# Patient Record
Sex: Female | Born: 1980 | Race: Black or African American | Hispanic: No | Marital: Single | Smoking: Never smoker
Health system: Southern US, Community
[De-identification: ages and names within clinical notes are randomized; demographics above are authoritative.]

## PROBLEM LIST (undated history)

## (undated) ENCOUNTER — Emergency Department (HOSPITAL_COMMUNITY): Payer: Self-pay

---

## 2001-05-26 ENCOUNTER — Ambulatory Visit (HOSPITAL_COMMUNITY): Admission: RE | Admit: 2001-05-26 | Discharge: 2001-05-26 | Payer: Self-pay | Admitting: Orthopedic Surgery

## 2001-05-26 ENCOUNTER — Encounter: Payer: Self-pay | Admitting: Orthopedic Surgery

## 2001-09-22 ENCOUNTER — Encounter: Payer: Self-pay | Admitting: Emergency Medicine

## 2001-09-22 ENCOUNTER — Emergency Department (HOSPITAL_COMMUNITY): Admission: EM | Admit: 2001-09-22 | Discharge: 2001-09-22 | Payer: Self-pay | Admitting: Emergency Medicine

## 2003-06-16 ENCOUNTER — Other Ambulatory Visit: Admission: RE | Admit: 2003-06-16 | Discharge: 2003-06-16 | Payer: Self-pay | Admitting: Family Medicine

## 2003-09-22 ENCOUNTER — Other Ambulatory Visit: Admission: RE | Admit: 2003-09-22 | Discharge: 2003-09-22 | Payer: Self-pay | Admitting: Family Medicine

## 2005-09-07 ENCOUNTER — Inpatient Hospital Stay (HOSPITAL_COMMUNITY): Admission: AD | Admit: 2005-09-07 | Discharge: 2005-09-07 | Payer: Self-pay | Admitting: Obstetrics and Gynecology

## 2006-04-04 ENCOUNTER — Ambulatory Visit (HOSPITAL_COMMUNITY): Admission: RE | Admit: 2006-04-04 | Discharge: 2006-04-04 | Payer: Self-pay | Admitting: Obstetrics and Gynecology

## 2006-04-05 ENCOUNTER — Inpatient Hospital Stay (HOSPITAL_COMMUNITY): Admission: AD | Admit: 2006-04-05 | Discharge: 2006-04-08 | Payer: Self-pay | Admitting: Obstetrics and Gynecology

## 2010-06-19 ENCOUNTER — Emergency Department (INDEPENDENT_AMBULATORY_CARE_PROVIDER_SITE_OTHER): Payer: Self-pay

## 2010-06-19 ENCOUNTER — Emergency Department (HOSPITAL_BASED_OUTPATIENT_CLINIC_OR_DEPARTMENT_OTHER)
Admission: EM | Admit: 2010-06-19 | Discharge: 2010-06-19 | Disposition: A | Discharge status: Home / Self Care | Payer: Self-pay | Attending: Emergency Medicine | Admitting: Emergency Medicine

## 2010-06-19 DIAGNOSIS — N831 Corpus luteum cyst of ovary, unspecified side: Secondary | ICD-10-CM

## 2010-06-19 DIAGNOSIS — N83209 Unspecified ovarian cyst, unspecified side: Secondary | ICD-10-CM | POA: Insufficient documentation

## 2010-06-19 DIAGNOSIS — N9489 Other specified conditions associated with female genital organs and menstrual cycle: Secondary | ICD-10-CM | POA: Insufficient documentation

## 2010-06-19 LAB — URINALYSIS, ROUTINE W REFLEX MICROSCOPIC
Bilirubin Urine: NEGATIVE
Glucose, UA: NEGATIVE mg/dL
Hgb urine dipstick: NEGATIVE
Ketones, ur: NEGATIVE mg/dL
Nitrite: NEGATIVE
Protein, ur: NEGATIVE mg/dL
Specific Gravity, Urine: 1.015 (ref 1.005–1.030)
Urobilinogen, UA: 0.2 mg/dL (ref 0.0–1.0)
pH: 6.5 (ref 5.0–8.0)

## 2010-06-19 LAB — PREGNANCY, URINE: Preg Test, Ur: NEGATIVE

## 2010-06-19 LAB — URINE MICROSCOPIC-ADD ON

## 2010-06-19 LAB — WET PREP, GENITAL

## 2010-06-20 LAB — GC/CHLAMYDIA PROBE AMP, GENITAL: Chlamydia, DNA Probe: NEGATIVE

## 2010-06-21 LAB — URINE CULTURE

## 2010-08-31 ENCOUNTER — Emergency Department (HOSPITAL_BASED_OUTPATIENT_CLINIC_OR_DEPARTMENT_OTHER)
Admission: EM | Admit: 2010-08-31 | Discharge: 2010-08-31 | Disposition: A | Discharge status: Home / Self Care | Payer: Self-pay | Attending: Emergency Medicine | Admitting: Emergency Medicine

## 2010-08-31 DIAGNOSIS — K029 Dental caries, unspecified: Secondary | ICD-10-CM | POA: Insufficient documentation

## 2010-09-29 ENCOUNTER — Emergency Department (HOSPITAL_BASED_OUTPATIENT_CLINIC_OR_DEPARTMENT_OTHER)
Admission: EM | Admit: 2010-09-29 | Discharge: 2010-09-29 | Disposition: A | Discharge status: Home / Self Care | Payer: Self-pay | Attending: Emergency Medicine | Admitting: Emergency Medicine

## 2010-09-29 DIAGNOSIS — K089 Disorder of teeth and supporting structures, unspecified: Secondary | ICD-10-CM | POA: Insufficient documentation

## 2011-10-10 ENCOUNTER — Encounter (HOSPITAL_BASED_OUTPATIENT_CLINIC_OR_DEPARTMENT_OTHER): Payer: Self-pay | Admitting: *Deleted

## 2011-10-10 ENCOUNTER — Emergency Department (HOSPITAL_BASED_OUTPATIENT_CLINIC_OR_DEPARTMENT_OTHER)
Admission: EM | Admit: 2011-10-10 | Discharge: 2011-10-10 | Disposition: A | Discharge status: Home / Self Care | Payer: Self-pay | Attending: Emergency Medicine | Admitting: Emergency Medicine

## 2011-10-10 DIAGNOSIS — R21 Rash and other nonspecific skin eruption: Secondary | ICD-10-CM | POA: Insufficient documentation

## 2011-10-10 DIAGNOSIS — L239 Allergic contact dermatitis, unspecified cause: Secondary | ICD-10-CM

## 2011-10-10 DIAGNOSIS — L309 Dermatitis, unspecified: Secondary | ICD-10-CM

## 2011-10-10 DIAGNOSIS — L299 Pruritus, unspecified: Secondary | ICD-10-CM

## 2011-10-10 MED ORDER — PREDNISONE 10 MG PO TABS: ORAL_TABLET | ORAL | Status: DC

## 2011-10-10 NOTE — ED Notes (Signed)
Pt reports an urticarial generalized rash x 3 days unrelieved after taking topical hydrocortisone.

## 2011-10-10 NOTE — ED Notes (Signed)
Hannah, PA-C at  bedside.

## 2011-10-10 NOTE — ED Provider Notes (Signed)
History     CSN: [id number]  Arrival date & time 10/10/11  1521   First MD Initiated Contact with Patient 10/10/11 1552      Chief Complaint  Patient presents with  . Rash    (Consider location/radiation/quality/duration/timing/severity/associated sxs/prior treatment) HPI Comments: 31yo female presents with a new onset rash x3 days [place]  Pt states she did not touch the plants or any other wildlife.  Hx of eczema, but patient states this is different.  Rash is severely itchy.  No new cosmetics, perfumes, laundry detergent, lotion, medications or foods.  She has taken benadryl and used 1% hydrocortisone cream without relief.  No one else in the household has been affected.  No fever, choriza, cough, wheezing or swelling.    Patient is a 31 y.o. female presenting with rash. The history is provided by the patient and medical records.  Rash  This is a new problem. The current episode started more than 2 days ago. The problem has not changed since onset.The problem is associated with an unknown factor. There has been no fever. The rash is present on the torso, back, abdomen, left upper leg, right upper leg, right lower leg and left lower leg. The pain is at a severity of 0/10. The patient is experiencing no pain. Associated symptoms include itching. Pertinent negatives include no blisters, no pain and no weeping. She has tried antihistamines and anti-itch cream for the symptoms. The treatment provided no relief.    History reviewed. No pertinent past medical history.  History reviewed. No pertinent past surgical history.  History reviewed. No pertinent family history.  History  Substance Use Topics  . Smoking status: Never Smoker   . Smokeless tobacco: Not on file  . Alcohol Use: No    OB History    Grav Para Term Preterm Abortions TAB SAB Ect Mult Living                  Review of Systems  Constitutional: Negative for fever and appetite change.  HENT:  Negative for congestion, facial swelling, rhinorrhea, sneezing, mouth sores, neck stiffness and sinus pressure.   Eyes: Negative for redness and itching.  Respiratory: Negative for cough, shortness of breath and wheezing.   Gastrointestinal: Negative for nausea, vomiting, abdominal pain, diarrhea and constipation.  Genitourinary: Negative for dysuria and decreased urine volume.  Musculoskeletal: Negative for joint swelling.  Skin: Positive for itching and rash. Negative for pallor.  Neurological: Negative for dizziness and headaches.  Hematological: Negative for adenopathy. Does not bruise/bleed easily.  All other systems reviewed and are negative.    Allergies  Review of patient's allergies indicates no known allergies.  Home Medications  No current outpatient prescriptions on file.  BP 116/70  Pulse 70  Temp 98.3 F (36.8 C)  Resp 16  Ht 5\' 4"  (1.626 m)  Wt 150 lb (68.04 kg)  BMI 25.75 kg/m2  SpO2 100%  LMP 09/27/2011  Physical Exam  Nursing note and vitals reviewed. Constitutional: She appears well-developed and well-nourished. No distress.  HENT:  Head: Normocephalic and atraumatic.  Mouth/Throat: Oropharynx is clear and moist. No oropharyngeal exudate.       No petechiae or lesion in the mouth or oropharynx.  Eyes: Conjunctivae are normal. Pupils are equal, round, and reactive to light. Right eye exhibits no discharge. Left eye exhibits no discharge. No scleral icterus.  Neck: Normal range of motion. Neck supple.  Cardiovascular: Normal rate, regular rhythm and normal heart sounds.  Pulmonary/Chest: Effort normal. She has no wheezes. She has rales.  Lymphadenopathy:    She has no cervical adenopathy.  Neurological: She is alert. She has normal reflexes.  Skin: Skin is warm and dry. Rash noted. She is not diaphoretic.       Patches of papular rash seen on back, chest, abdomen, arms, upper thighs and lower legs.  No lesions between the digits of upper or lower  extremity, on the palms or soles of feet, or on the face/scalp.  Patches on the knees, neck and elbow appear to have an eczema component.  No petechiae noted.    Psychiatric: She has a normal mood and affect. Her behavior is normal. Judgment and thought content normal.    ED Course  Procedures (including critical care time)  Labs Reviewed - No data to display No results found.   No diagnosis found.    MDM  Pt presents with rash x 3 days without further symptoms. Hx of eczema.  No petechiae, fever, or other systemic symptoms.  Some areas of rash look like eczema.  Possible allergic dermatitis.  Tx with benadryl PO for itching and prednisone dose pack.  F/U with dermatologist (appointment already scheduled by patient for 10/13/11).          Dahlia Client Antanisha Mohs, PA-C 10/10/11 1637

## 2011-10-10 NOTE — ED Notes (Signed)
Pt c/o rash to entire body x 2 days 

## 2011-10-13 NOTE — ED Provider Notes (Signed)
Medical screening examination/treatment/procedure(s) were performed by non-physician practitioner and as supervising physician I was immediately available for consultation/collaboration.   Gwyneth Sprout, MD 10/13/11 [phone]

## 2011-10-28 ENCOUNTER — Encounter (HOSPITAL_BASED_OUTPATIENT_CLINIC_OR_DEPARTMENT_OTHER): Payer: Self-pay | Admitting: *Deleted

## 2011-10-28 ENCOUNTER — Emergency Department (HOSPITAL_BASED_OUTPATIENT_CLINIC_OR_DEPARTMENT_OTHER)
Admission: EM | Admit: 2011-10-28 | Discharge: 2011-10-28 | Disposition: A | Discharge status: Home / Self Care | Payer: Self-pay | Attending: Emergency Medicine | Admitting: Emergency Medicine

## 2011-10-28 DIAGNOSIS — R21 Rash and other nonspecific skin eruption: Secondary | ICD-10-CM | POA: Insufficient documentation

## 2011-10-28 MED ORDER — PREDNISONE 10 MG PO TABS: 20.0000 mg | ORAL_TABLET | Freq: Every day | ORAL | Status: DC

## 2011-10-28 NOTE — ED Notes (Addendum)
Rash on arms and torso, itching. Was seen a few weeks ago for same, but rash has returned.

## 2011-10-28 NOTE — ED Provider Notes (Signed)
History   This chart was scribed for Hilario Quarry, MD by Shari Heritage. The Emily Mcguire was seen in room MH07/MH07. Emily Mcguire's care was started at 1453.     CSN: [id number]  Arrival date & time 10/28/11  1453   First MD Initiated Contact with Emily Mcguire 10/28/11 1541      Chief Complaint  Emily Mcguire presents with  . Rash    (Consider location/radiation/quality/duration/timing/severity/associated sxs/prior treatment) Emily Mcguire is a 31 y.o. female presenting with rash. The history is provided by the Emily Mcguire. No language interpreter was used.  Rash  This is a recurrent problem. The current episode started yesterday. The problem has not changed since onset.The problem is associated with an unknown factor. There has been no fever. The rash is present on the abdomen, back, left arm, right arm, right upper leg and left upper leg. The Emily Mcguire is experiencing no pain. Associated symptoms include itching. Pertinent negatives include no blisters, no pain and no weeping. She has tried steriods and antihistamines for the symptoms.   Emily Mcguire is a 31 y.o. female who presents to the Emergency Department complaining of an recurrent, itchy rash to both arms, both thighs, chest and back onset yesterday. Emily Mcguire denies fever, cough, HA and SOB. She does not take any regular medicines. Emily Mcguire lives with son and he doesn't have any rashes. Emily Mcguire has been taking Benadryl to minimal relief, but hasn't taken any today. Emily Mcguire denies any significant medical or surgical history. Emily Mcguire doesn't smoke. Emily Mcguire drinks occasionally.  Emily Mcguire states that she was treated here for the same rash by Oliver Barre, PA on 10/10/11. She was prescribed a dosage pack of Prednisone that she finished several days ago. The Prednisone provided significant relief from the rash and itching, but the rash returned after her Prednisone course ended. Emily Mcguire has an appointment scheduled with her dermatologist on August 20.  History  reviewed. No pertinent past medical history.  History reviewed. No pertinent past surgical history.  No family history on file.  History  Substance Use Topics  . Smoking status: Never Smoker   . Smokeless tobacco: Not on file  . Alcohol Use: No    OB History    Grav Para Term Preterm Abortions TAB SAB Ect Mult Living                  Review of Systems  Skin: Positive for itching and rash.  All other systems reviewed and are negative.    Allergies  Review of Emily Mcguire's allergies indicates no known allergies.  Home Medications   Current Outpatient Rx  Name Route Sig Dispense Refill  . DIPHENHYDRAMINE HCL 12.5 MG/5ML PO LIQD Oral Take 10 mg by mouth 4 (four) times daily as needed. Emily Mcguire used this medication for her rash.    Marland Kitchen PREDNISONE 10 MG PO TABS  6,5,4,3,2,1 taper 21 tablet 0    BP 127/69  Pulse 82  Temp 98.6 F (37 C) (Oral)  Resp 16  Ht 5\' 5"  (1.651 m)  Wt 155 lb (70.308 kg)  BMI 25.79 kg/m2  SpO2 100%  LMP 09/27/2011  Physical Exam  Nursing note and vitals reviewed. Constitutional: She is oriented to person, place, and time. She appears well-developed and well-nourished.  HENT:  Head: Normocephalic and atraumatic.  Eyes: Conjunctivae and EOM are normal. Pupils are equal, round, and reactive to light.  Neck: Normal range of motion. Neck supple.  Cardiovascular: Normal rate and regular rhythm.   Pulmonary/Chest: Effort normal and breath sounds normal.  Abdominal:  Soft. Bowel sounds are normal.  Musculoskeletal: Normal range of motion.  Neurological: She is alert and oriented to person, place, and time.  Skin: Skin is warm and dry. Rash noted. Rash is macular and papular.       Macular, papular diffuse rash to arms, abdomen and chest.  Psychiatric: She has a normal mood and affect.    ED Course  Procedures (including critical care time) DIAGNOSTIC STUDIES: Oxygen Saturation is 100% on room air, normal by my interpretation.    COORDINATION OF  CARE: 3:43pm- Emily Mcguire informed of current plan for treatment and evaluation and agrees with plan at this time. Will discharge Emily Mcguire home with a prescription for 5-day course of Prednisone.   Labs Reviewed - No data to display No results found.   No diagnosis found.    MDM  I personally performed the services described in this documentation, which was scribed in my presence. The recorded information has been reviewed and considered.   Hilario Quarry, MD 11/05/11 1556

## 2017-05-16 ENCOUNTER — Emergency Department (HOSPITAL_COMMUNITY)
Admission: EM | Admit: 2017-05-16 | Discharge: 2017-05-16 | Disposition: A | Discharge status: Home / Self Care | Payer: Self-pay | Attending: Emergency Medicine | Admitting: Emergency Medicine

## 2017-05-16 ENCOUNTER — Other Ambulatory Visit: Payer: Self-pay

## 2017-05-16 ENCOUNTER — Encounter (HOSPITAL_COMMUNITY): Payer: Self-pay | Admitting: Emergency Medicine

## 2017-05-16 DIAGNOSIS — M25562 Pain in left knee: Secondary | ICD-10-CM | POA: Insufficient documentation

## 2017-05-16 MED ORDER — DICLOFENAC SODIUM 1 % TD GEL: 2.0000 g | Freq: Four times a day (QID) | TRANSDERMAL | 0 refills | Status: AC

## 2017-05-16 MED ORDER — IBUPROFEN 600 MG PO TABS: 600.0000 mg | ORAL_TABLET | Freq: Four times a day (QID) | ORAL | 0 refills | Status: DC | PRN

## 2017-05-16 NOTE — ED Triage Notes (Signed)
Pt verbalizes left knee to left calf pain ongoing for 1.5 months; denies injury.

## 2017-05-16 NOTE — Discharge Instructions (Signed)
Wear the knee sleeve as needed to help with your pain.  Take 600 mg of ibuprofen with food every 8 hours for pain control.  You can also alternate with Tylenol, 650 mg, and take 1 dose of ibuprofen and then 3 hours later take a dose of Tylenol and repeat this regimen.  A thin layer of Voltaren gel can be applied to the left knee over areas that are sore up to 4 times daily. Apply ice for 15-20 minutes as frequently as needed to help with pain and swelling.  When you are sitting and resting, please elevate the left leg above the level of your heart to help with pain and swelling.  Start to strengthen and exercise the knee with the attached exercises as your pain allows.  For the next week at the gym, try to avoid activities that directly use the left knee, such as aerobics, so that you do not further exacerbate your pain.  If your pain does not start to improve with this regimen in the next week, please call Emily Mcguire and schedule follow-up appointment with sports medicine.  If you develop new or worsening symptoms, including if the knee becomes red and hot to the touch, if you develop shortness of breath, fever, chills, weakness, or numbness in the left leg, please return to the emergency department for reevaluation.

## 2017-05-16 NOTE — ED Provider Notes (Signed)
[facility] Provider Note   CSN: [id number] Arrival date & time: 05/16/17  1122     History   Chief Complaint Chief Complaint  Patient presents with  . Leg Pain    HPI Emily Mcguire is a 37 y.o. female with a history of left knee pain that began about a month and a half ago and has gradually worsened.  The pain is constant, characterized as pulling, and worse with flexion of the left knee the pain radiates down the posterior left calf.  No alleviating symptoms.  She is treated her pain with Tylenol prior to arrival.  She recently began going to the gym and working with a trainer performing 30 minutes of aerobic exercise several times a week approximately a month and a half ago.  No history of left knee surgery or injury.  She denies left ankle or hip pain, right leg pain, numbness, weakness, fever, chills, chest pain, shortness of breath, or rash.  No past medical history of gout.  No family history of DVT or PE.  She does not take oral contraceptives.  No recent surgery, immobilization, or long travel.  The history is provided by the patient. No language interpreter was used.  Leg Pain   This is a new problem. The current episode started more than 1 week ago. The problem has been gradually worsening. The pain is present in the left knee. Quality: "pulling" The pain is mild. Pertinent negatives include no numbness and full range of motion. The symptoms are aggravated by activity. Treatments tried: Tylenol. The treatment provided no relief. There has been no history of extremity trauma. Family history is significant for no rheumatoid arthritis and no gout.    History reviewed. No pertinent past medical history.  There are no active problems to display for this patient.   History reviewed. No pertinent surgical history.  OB History    No data available       Home Medications    Prior to Admission medications   Medication Sig Start Date  End Date Taking? Authorizing Provider  diphenhydrAMINE (BENADRYL) 25 MG tablet Take 25 mg by mouth every 6 (six) hours as needed. For itching.   Yes [provider]  diclofenac sodium (VOLTAREN) 1 % GEL Apply 2 g topically 4 (four) times daily. 05/16/17   Dannika Hilgeman A, PA-C  ibuprofen (ADVIL,MOTRIN) 600 MG tablet Take 1 tablet (600 mg total) by mouth every 6 (six) hours as needed. 05/16/17   Armonii Sieh A, PA-C    Family History No family history on file.  Social History Social History   Tobacco Use  . Smoking status: Never Smoker  Substance Use Topics  . Alcohol use: No  . Drug use: No     Allergies   Patient has no known allergies.   Review of Systems Review of Systems  Constitutional: Negative for activity change.  Respiratory: Negative for shortness of breath.   Cardiovascular: Negative for chest pain.  Gastrointestinal: Negative for abdominal pain.  Musculoskeletal: Positive for arthralgias, gait problem and myalgias. Negative for back pain and joint swelling.  Skin: Negative for rash.  Neurological: Negative for weakness and numbness.     Physical Exam Updated Vital Signs BP 130/88   Pulse 77   Temp 97.9 F (36.6 C)   Resp 15   LMP 05/03/2017   SpO2 100%   Physical Exam  Constitutional: No distress.  HENT:  Head: Normocephalic.  Eyes: Conjunctivae are normal.  Neck:  Neck supple.  Cardiovascular: Normal rate and regular rhythm. Exam reveals no gallop and no friction rub.  No murmur heard. Pulmonary/Chest: Effort normal. No respiratory distress.  Abdominal: Soft. She exhibits no distension.  Musculoskeletal:  Mild tenderness to palpation along the medial joint line the left knee and to the posterior knee.  No lateral joint line tenderness.  Full active and passive range of motion of the left knee, hip, and ankle.  No overlying swelling, warmth, or erythema to the left knee.  Pain is increased with flexion.  The patella tracks well.  Negative  anterior and posterior drawer test.  Negative valgus and varus stress test.   Neurological: She is alert.  Skin: Skin is warm. No rash noted.  Psychiatric: Her behavior is normal.  Nursing note and vitals reviewed.    ED Treatments / Results  Labs (all labs ordered are listed, but only abnormal results are displayed) Labs Reviewed - No data to display  EKG  EKG Interpretation None       Radiology No results found.  Procedures Procedures (including critical care time)  Medications Ordered in ED Medications - No data to display   Initial Impression / Assessment and Plan / ED Course  I have reviewed the triage vital signs and the nursing notes.  Pertinent labs & imaging results that were available during my care of the patient were reviewed by me and considered in my medical decision making (see chart for details).     37 year old female presenting with left knee pain that began approximately 1.5 months ago after she started going to the gym and performing aerobic exercise several times a week.  Physical exam, she has mild tenderness to palpation over the medial joint line and to the posterior knee.  She declines an x-ray and pain control in the ED at this time.  Suspect musculoskeletal injury, possible tendinitis versus Baker's cyst.  Doubt gout, septic joint, injury to the ACL, PCL, MCL, LCL, dislocation, or fracture.  Pt advised to follow up with orthopedics if symptoms persist for possibility of missed fracture diagnosis. Patient given brace while in ED, conservative therapy recommended and discussed. Patient will be dc home & is agreeable with above plan.   Final Clinical Impressions(s) / ED Diagnoses   Final diagnoses:  Acute pain of left knee    ED Discharge Orders        Ordered    diclofenac sodium (VOLTAREN) 1 % GEL  4 times daily     05/16/17 1443    ibuprofen (ADVIL,MOTRIN) 600 MG tablet  Every 6 hours PRN     05/16/17 1443       Devona Holmes A,  PA-C 05/16/17 1453    Charlynne Pander, MD 05/16/17 1550

## 2020-01-07 ENCOUNTER — Ambulatory Visit: Payer: Medicaid Other | Admitting: Obstetrics and Gynecology

## 2020-01-12 ENCOUNTER — Other Ambulatory Visit: Payer: Self-pay | Admitting: Family Medicine

## 2020-01-12 DIAGNOSIS — Z1231 Encounter for screening mammogram for malignant neoplasm of breast: Secondary | ICD-10-CM

## 2020-02-05 ENCOUNTER — Other Ambulatory Visit: Payer: Self-pay | Admitting: Nurse Practitioner

## 2020-02-05 ENCOUNTER — Other Ambulatory Visit: Payer: Self-pay | Admitting: Pediatrics

## 2020-02-05 DIAGNOSIS — N6452 Nipple discharge: Secondary | ICD-10-CM

## 2020-02-15 ENCOUNTER — Encounter: Payer: Self-pay | Admitting: Obstetrics and Gynecology

## 2020-02-15 ENCOUNTER — Ambulatory Visit (INDEPENDENT_AMBULATORY_CARE_PROVIDER_SITE_OTHER): Payer: Medicaid Other | Admitting: Obstetrics and Gynecology

## 2020-02-15 ENCOUNTER — Other Ambulatory Visit: Payer: Self-pay

## 2020-02-15 VITALS — BP 126/78 | HR 72 | Ht 65.0 in | Wt 173.0 lb

## 2020-02-15 DIAGNOSIS — Z30430 Encounter for insertion of intrauterine contraceptive device: Secondary | ICD-10-CM

## 2020-02-15 LAB — POCT URINE PREGNANCY: Preg Test, Ur: NEGATIVE

## 2020-02-15 MED ORDER — LEVONORGESTREL 20 MCG/24HR IU IUD: INTRAUTERINE_SYSTEM | Freq: Once | INTRAUTERINE | Status: AC

## 2020-02-15 NOTE — Addendum Note (Signed)
Addended by: Leola Brazil on: 02/15/2020 09:04 AM   Modules accepted: Orders

## 2020-02-15 NOTE — Progress Notes (Signed)
39 yo P1 with BMI 28 who is here for contraception counseling. Patient reports a monthly period lasting 8 days heavy in flow. She has previously tried COC and reports irregular vaginal bleeding. Patient states that the birth control that worked the best for her was the [name] IUD. She is interested in having the IUD today. Patient is without any complaints. She denies pelvic pain or abnormal discharge. She is sexually active using condoms for contraception  No past medical history on file. No past surgical history on file. No family history on file. Social History   Tobacco Use  . Smoking status: Never Smoker  Substance Use Topics  . Alcohol use: No  . Drug use: No   ROS See pertinent in HPI. All other systems reviewed and non contributory  Blood pressure 126/78, pulse 72, height 5\' 5"  (1.651 m), weight 173 lb (78.5 kg), last menstrual period 02/09/2020.  GENERAL: Well-developed, well-nourished female in no acute distress.  ABDOMEN: Soft, nontender, nondistended. No organomegaly. PELVIC: Normal external female genitalia. Vagina is pink and rugated.  Normal discharge. Normal appearing cervix. Uterus is normal in size.  No adnexal mass or tenderness. EXTREMITIES: No cyanosis, clubbing, or edema, 2+ distal pulses.  A/P 39 yo with  - Normal pap smear 03/2019 - Patient was referred for mammogram by PCP - IUD Procedure Note Patient identified, informed consent performed, signed copy in chart, time out was performed.  Urine pregnancy test negative.  Speculum placed in the vagina.  Cervix visualized.  Cleaned with Betadine x 2.  Grasped anteriorly with a single tooth tenaculum.  Uterus sounded to 8 cm.  [name] IUD placed per manufacturer's recommendations.  Strings trimmed to 3 cm. Tenaculum was removed, good hemostasis noted.  Patient tolerated procedure well.   Patient given post procedure instructions and [name] care card with expiration date.  Patient is asked to check IUD strings  periodically and follow up in 4-6 weeks for IUD check.

## 2020-03-02 ENCOUNTER — Other Ambulatory Visit: Payer: Medicaid Other

## 2020-03-02 DIAGNOSIS — Z20822 Contact with and (suspected) exposure to covid-19: Secondary | ICD-10-CM

## 2020-03-03 LAB — NOVEL CORONAVIRUS, NAA: SARS-CoV-2, NAA: NOT DETECTED

## 2020-03-03 LAB — SARS-COV-2, NAA 2 DAY TAT

## 2020-03-04 ENCOUNTER — Telehealth: Payer: Self-pay

## 2020-03-04 NOTE — Telephone Encounter (Signed)
Patient called to get her COVID test results informed patient that results were negative.

## 2020-03-08 ENCOUNTER — Ambulatory Visit
Admission: RE | Admit: 2020-03-08 | Discharge: 2020-03-08 | Disposition: A | Discharge status: Home / Self Care | Payer: Medicaid Other | Source: Ambulatory Visit | Attending: Nurse Practitioner | Admitting: Nurse Practitioner

## 2020-03-08 ENCOUNTER — Other Ambulatory Visit: Payer: Self-pay

## 2020-03-08 DIAGNOSIS — N6452 Nipple discharge: Secondary | ICD-10-CM

## 2020-03-14 ENCOUNTER — Ambulatory Visit: Payer: Medicaid Other | Admitting: Obstetrics and Gynecology

## 2020-04-01 ENCOUNTER — Encounter: Payer: Self-pay | Admitting: Obstetrics and Gynecology

## 2020-04-01 ENCOUNTER — Other Ambulatory Visit: Payer: Self-pay

## 2020-04-01 ENCOUNTER — Ambulatory Visit (INDEPENDENT_AMBULATORY_CARE_PROVIDER_SITE_OTHER): Payer: Medicaid Other | Admitting: Obstetrics and Gynecology

## 2020-04-01 VITALS — BP 133/76 | HR 84 | Ht 65.0 in | Wt 170.7 lb

## 2020-04-01 DIAGNOSIS — Z30431 Encounter for routine checking of intrauterine contraceptive device: Secondary | ICD-10-CM

## 2020-04-01 NOTE — Progress Notes (Signed)
40 yo here for IUD check. Patient had IUD inserted on 02/15/20. She reports intermittent vaginal spotting since insertion. She denies any pelvic pain or abnormal discharge. Patient has not been sexually active since insertion  No past medical history on file. No past surgical history on file. Family History  Problem Relation Age of Onset  . Breast cancer Mother   . Breast cancer Maternal Aunt    Social History   Tobacco Use  . Smoking status: Never Smoker  . Smokeless tobacco: Never Used  Substance Use Topics  . Alcohol use: No  . Drug use: No   ROS See pertinent in HPI. All other systems reviewed and negative Blood pressure 133/76, pulse 84, height 5\' 5"  (1.651 m), weight 170 lb 11.2 oz (77.4 kg).  GENERAL: Well-developed, well-nourished female in no acute distress.  ABDOMEN: Soft, nontender, nondistended. No organomegaly. PELVIC: Normal external female genitalia. Vagina is pink and rugated.  Normal discharge. Normal appearing cervix with IUD strings extending from the os 3 cm and curled anteriorly EXTREMITIES: No cyanosis, clubbing, or edema, 2+ distal pulses.  A/P 40 yo here for IUD check - IUD appears to be in the appropriate location - Reassurance provided regarding irregular bleeding post insertion which is common for up to 6 months - RTC prn

## 2020-04-01 NOTE — Progress Notes (Signed)
Pt is in the office for IUD string check, inserted on 02-15-20.

## 2020-08-11 ENCOUNTER — Ambulatory Visit
Admission: RE | Admit: 2020-08-11 | Discharge: 2020-08-11 | Disposition: A | Discharge status: Home / Self Care | Payer: Medicaid Other | Source: Ambulatory Visit | Attending: Family | Admitting: Family

## 2020-08-11 ENCOUNTER — Other Ambulatory Visit: Payer: Self-pay | Admitting: Family

## 2020-08-11 DIAGNOSIS — W19XXXA Unspecified fall, initial encounter: Secondary | ICD-10-CM

## 2020-08-11 DIAGNOSIS — M25521 Pain in right elbow: Secondary | ICD-10-CM

## 2021-02-06 ENCOUNTER — Ambulatory Visit: Payer: Medicaid Other

## 2021-04-21 ENCOUNTER — Other Ambulatory Visit: Payer: Self-pay | Admitting: *Deleted

## 2021-04-21 ENCOUNTER — Encounter: Payer: Self-pay | Admitting: *Deleted

## 2021-04-21 MED ORDER — METRONIDAZOLE 500 MG PO TABS: 500.0000 mg | ORAL_TABLET | Freq: Two times a day (BID) | ORAL | 0 refills | Status: DC

## 2021-04-21 NOTE — Progress Notes (Signed)
TC from patient report symptoms of BV. RX sent per protocol

## 2021-08-14 ENCOUNTER — Other Ambulatory Visit: Payer: Self-pay | Admitting: Nurse Practitioner

## 2021-08-14 DIAGNOSIS — Z1231 Encounter for screening mammogram for malignant neoplasm of breast: Secondary | ICD-10-CM

## 2021-08-18 ENCOUNTER — Ambulatory Visit
Admission: RE | Admit: 2021-08-18 | Discharge: 2021-08-18 | Disposition: A | Discharge status: Home / Self Care | Payer: Medicaid Other | Source: Ambulatory Visit | Attending: Nurse Practitioner | Admitting: Nurse Practitioner

## 2021-08-18 DIAGNOSIS — Z1231 Encounter for screening mammogram for malignant neoplasm of breast: Secondary | ICD-10-CM

## 2021-09-25 ENCOUNTER — Telehealth: Payer: Self-pay | Admitting: *Deleted

## 2021-09-25 NOTE — Telephone Encounter (Signed)
Pt called to office stating she is having some spotting and ?yeast infection.  Pt states she has appt later this week but would like treatment. Attempt to return call, LVM to call office.

## 2021-09-27 ENCOUNTER — Other Ambulatory Visit: Payer: Self-pay | Admitting: *Deleted

## 2021-09-27 MED ORDER — FLUCONAZOLE 150 MG PO TABS: 150.0000 mg | ORAL_TABLET | Freq: Once | ORAL | 0 refills | Status: AC

## 2021-09-27 NOTE — Progress Notes (Signed)
Pt called office with symptoms of yeast infection.  Diflucan sent per protocol.

## 2021-10-12 ENCOUNTER — Ambulatory Visit (INDEPENDENT_AMBULATORY_CARE_PROVIDER_SITE_OTHER): Payer: Medicaid Other | Admitting: Obstetrics and Gynecology

## 2021-10-12 ENCOUNTER — Other Ambulatory Visit (HOSPITAL_COMMUNITY)
Admission: RE | Admit: 2021-10-12 | Discharge: 2021-10-12 | Disposition: A | Discharge status: Home / Self Care | Payer: Medicaid Other | Source: Ambulatory Visit | Attending: Obstetrics and Gynecology | Admitting: Obstetrics and Gynecology

## 2021-10-12 ENCOUNTER — Encounter: Payer: Self-pay | Admitting: Obstetrics and Gynecology

## 2021-10-12 VITALS — BP 122/79 | HR 79 | Ht 65.0 in | Wt 193.0 lb

## 2021-10-12 DIAGNOSIS — Z01419 Encounter for gynecological examination (general) (routine) without abnormal findings: Secondary | ICD-10-CM

## 2021-10-12 DIAGNOSIS — Z30431 Encounter for routine checking of intrauterine contraceptive device: Secondary | ICD-10-CM

## 2021-10-12 NOTE — Progress Notes (Signed)
Subjective:     Emily Mcguire is a 41 y.o. female P1 with LMP 09/29/21 and BMI 32 who is here for a comprehensive physical exam. The patient reports a month long history of vaginal bleeding with passage of clots. The bleeding stopped a few days ago. This was unusual for her as he typically has a 5-7 day period. Patient is sexually active using IUD for contraception. She denies pelvic pain or abnormal discharge. Patient denies urinary incontinence.   No past medical history on file. No past surgical history on file. Family History  Problem Relation Age of Onset   Breast cancer Mother    Breast cancer Maternal Aunt     Social History   Socioeconomic History   Marital status: Single    Spouse name: Not on file   Number of children: Not on file   Years of education: Not on file   Highest education level: Not on file  Occupational History   Not on file  Tobacco Use   Smoking status: Never   Smokeless tobacco: Never  Substance and Sexual Activity   Alcohol use: Yes    Comment: rarely   Drug use: No   Sexual activity: Yes    Birth control/protection: I.U.D.  Other Topics Concern   Not on file  Social History Narrative   Not on file   Social Determinants of Health   Financial Resource Strain: Not on file  Food Insecurity: Not on file  Transportation Needs: Not on file  Physical Activity: Not on file  Stress: Not on file  Social Connections: Not on file  Intimate Partner Violence: Not on file   Health Maintenance  Topic Date Due   COVID-19 Vaccine (1) Never done   HIV Screening  Never done   Hepatitis C Screening  Never done   TETANUS/TDAP  Never done   INFLUENZA VACCINE  10/24/2021   PAP SMEAR-Modifier  03/31/2022   MAMMOGRAM  08/19/2022   HPV VACCINES  Aged Out       Review of Systems Pertinent items noted in HPI and remainder of comprehensive ROS otherwise negative.   Objective:  Blood pressure 122/79, pulse 79, height 5\' 5"  (1.651 m), weight 193 lb (87.5  kg), last menstrual period 09/29/2021.    GENERAL: Well-developed, well-nourished female in no acute distress.  HEENT: Normocephalic, atraumatic. Sclerae anicteric.  NECK: Supple. Normal thyroid.  LUNGS: Clear to auscultation bilaterally.  HEART: Regular rate and rhythm. BREASTS: Symmetric in size. No palpable masses or lymphadenopathy, skin changes, or nipple drainage. ABDOMEN: Soft, nontender, nondistended. No organomegaly. PELVIC: Normal external female genitalia. Vagina is pink and rugated.  Normal discharge. Normal appearing cervix, IUD strings not visualized at the os. Uterus is normal in size. No adnexal mass or tenderness. Chaperone present during the pelvic exam EXTREMITIES: No cyanosis, clubbing, or edema, 2+ distal pulses.    Assessment:    Healthy female exam.      Plan:    Pap smear collected STI screening per patient request Pelvic ultrasound ordered Patient will be contacted with abnormal results Advised the use of condoms until IUD location is confirmed See After Visit Summary for Counseling Recommendations

## 2021-10-13 LAB — HEPB+HEPC+HIV PANEL
HIV Screen 4th Generation wRfx: NONREACTIVE
Hep B C IgM: NEGATIVE
Hep B Core Total Ab: NEGATIVE
Hep B E Ab: NEGATIVE
Hep B E Ag: NEGATIVE
Hep B Surface Ab, Qual: REACTIVE
Hep C Virus Ab: NONREACTIVE
Hepatitis B Surface Ag: NEGATIVE

## 2021-10-13 LAB — CERVICOVAGINAL ANCILLARY ONLY
Chlamydia: NEGATIVE
Comment: NEGATIVE
Comment: NEGATIVE
Comment: NORMAL
Neisseria Gonorrhea: NEGATIVE
Trichomonas: NEGATIVE

## 2021-10-13 LAB — RPR: RPR Ser Ql: NONREACTIVE

## 2021-10-16 LAB — CYTOLOGY - PAP
Comment: NEGATIVE
Diagnosis: NEGATIVE
High risk HPV: NEGATIVE

## 2021-10-18 ENCOUNTER — Ambulatory Visit (HOSPITAL_BASED_OUTPATIENT_CLINIC_OR_DEPARTMENT_OTHER)
Admission: RE | Admit: 2021-10-18 | Discharge: 2021-10-18 | Disposition: A | Discharge status: Home / Self Care | Payer: Medicaid Other | Source: Ambulatory Visit | Attending: Obstetrics and Gynecology | Admitting: Obstetrics and Gynecology

## 2021-10-18 DIAGNOSIS — Z30431 Encounter for routine checking of intrauterine contraceptive device: Secondary | ICD-10-CM | POA: Insufficient documentation

## 2021-10-25 ENCOUNTER — Telehealth: Payer: Self-pay

## 2021-10-25 NOTE — Telephone Encounter (Signed)
Pt reports vaginal discharge and irritation, advised to make appt for nurse visit/self swab, pt agreed.

## 2022-05-02 ENCOUNTER — Ambulatory Visit (INDEPENDENT_AMBULATORY_CARE_PROVIDER_SITE_OTHER): Payer: Medicaid Other

## 2022-05-02 ENCOUNTER — Other Ambulatory Visit (HOSPITAL_COMMUNITY)
Admission: RE | Admit: 2022-05-02 | Discharge: 2022-05-02 | Disposition: A | Discharge status: Home / Self Care | Payer: Medicaid Other | Source: Ambulatory Visit | Attending: Obstetrics | Admitting: Obstetrics

## 2022-05-02 VITALS — Wt 204.5 lb

## 2022-05-02 DIAGNOSIS — N898 Other specified noninflammatory disorders of vagina: Secondary | ICD-10-CM | POA: Insufficient documentation

## 2022-05-02 NOTE — Progress Notes (Signed)
..  SUBJECTIVE:  42 y.o. female complains of clear vaginal discharge for 4 day(s). Denies abnormal vaginal bleeding or significant pelvic pain or fever. No UTI symptoms. Denies history of known exposure to STD.  No LMP recorded. (Menstrual status: IUD).  OBJECTIVE:  She appears well, afebrile. Urine dipstick: not done.  ASSESSMENT:  Vaginal Discharge  Vaginal Odor   PLAN:  GC, chlamydia, trichomonas, BVAG, CVAG probe sent to lab. Treatment: To be determined once lab results are received ROV prn if symptoms persist or worsen.

## 2022-05-03 LAB — CERVICOVAGINAL ANCILLARY ONLY
Bacterial Vaginitis (gardnerella): POSITIVE — AB
Candida Glabrata: NEGATIVE
Candida Vaginitis: NEGATIVE
Chlamydia: NEGATIVE
Comment: NEGATIVE
Comment: NEGATIVE
Comment: NEGATIVE
Comment: NEGATIVE
Comment: NEGATIVE
Comment: NORMAL
Neisseria Gonorrhea: NEGATIVE
Trichomonas: NEGATIVE

## 2022-05-03 LAB — HEPATITIS C ANTIBODY: Hep C Virus Ab: NONREACTIVE

## 2022-05-03 LAB — HIV ANTIBODY (ROUTINE TESTING W REFLEX): HIV Screen 4th Generation wRfx: NONREACTIVE

## 2022-05-03 LAB — RPR: RPR Ser Ql: NONREACTIVE

## 2022-05-03 LAB — HEPATITIS B SURFACE ANTIGEN: Hepatitis B Surface Ag: NEGATIVE

## 2022-05-04 ENCOUNTER — Other Ambulatory Visit: Payer: Self-pay

## 2022-05-04 DIAGNOSIS — B9689 Other specified bacterial agents as the cause of diseases classified elsewhere: Secondary | ICD-10-CM

## 2022-05-04 MED ORDER — METRONIDAZOLE 500 MG PO TABS: 500.0000 mg | ORAL_TABLET | Freq: Two times a day (BID) | ORAL | 0 refills | Status: DC

## 2022-07-16 ENCOUNTER — Ambulatory Visit (INDEPENDENT_AMBULATORY_CARE_PROVIDER_SITE_OTHER): Payer: Medicaid Other | Admitting: Obstetrics and Gynecology

## 2022-07-16 ENCOUNTER — Encounter: Payer: Self-pay | Admitting: Obstetrics and Gynecology

## 2022-07-16 VITALS — BP 126/76 | HR 80 | Ht 64.0 in | Wt 217.0 lb

## 2022-07-16 DIAGNOSIS — N939 Abnormal uterine and vaginal bleeding, unspecified: Secondary | ICD-10-CM | POA: Diagnosis not present

## 2022-07-16 MED ORDER — NORETHINDRONE ACETATE 5 MG PO TABS: 5.0000 mg | ORAL_TABLET | Freq: Every day | ORAL | 2 refills | Status: DC

## 2022-07-16 NOTE — Progress Notes (Signed)
42 yo with AUB presenting today for further evaluation. Patient reports a monthly cycle heavy in flow with lingering vaginal spotting. She reports vaginal bleeding for 5-7 days with dark vaginal spotting for a few more days. She denies any other complaints or pelvic pain. She is sexually active using [name] IUD for contraception and cycle control since 2021. Patient is aware that she has a fibroid uterus  History reviewed. No pertinent past medical history. History reviewed. No pertinent surgical history. Family History  Problem Relation Age of Onset   Breast cancer Mother    Breast cancer Maternal Aunt    Social History   Tobacco Use   Smoking status: Never   Smokeless tobacco: Never  Substance Use Topics   Alcohol use: Yes    Comment: rarely   Drug use: No   ROS See pertinent in HPI. All other systems reviewed and non contributory Blood pressure 126/76, pulse 80, height  (1.626 m), weight 217 lb (98.4 kg), last menstrual period 06/14/2022. GENERAL: Well-developed, well-nourished female in no acute distress.  NEURO: alert and oriented x 3  09/2021 ultrasound FINDINGS: Uterus   Measurements: 9.8 x 6.6 x 5.8 cm = volume: 195 mL. Anteverted. Heterogeneous myometrium. Posterior wall transmural leiomyoma 5.9 x 4.7 x 5.1 cm. No additional masses.   Endometrium   Thickness: 13 mm. IUD identified in expected position at upper uterine segment endometrial canal. No endometrial fluid or mass.   Right ovary   Measurements: 2.9 x 1.6 x 1.6 cm = volume: 3.9 mL. Normal morphology without mass   Left ovary   Measurements: 4.4 x 2.5 x 2.1 cm = volume: 11.7 mL. Tiny hemorrhagic corpus lutea; no follow-up imaging recommended   Other findings   Trace free pelvic fluid.  No adnexal masses.   IMPRESSION: IUD in expected position at upper uterine segment endometrial canal.   5.9 cm diameter transmural leiomyoma posterior upper uterus.   Remainder of exam unremarkable.      Electronically Signed   By: Ulyses Southward M.D.   On: 10/18/2021 12:37  A/P 42 yo with fibroid uterus and AUB - Discussed fibroid or perimenopausal state as possible etiologies of AUB - Discussed medical and surgical management options with the patient including [place] and [name]. Patient desires to defer hysterectomy as last option and is interested in [place] - Rx Aygestin provided in the interim. Patient will be scheduled with Dr. Donavan Foil to further discuss [name] - RTC in July for annual exam

## 2022-07-17 ENCOUNTER — Other Ambulatory Visit: Payer: Self-pay | Admitting: Family Medicine

## 2022-07-17 DIAGNOSIS — Z1231 Encounter for screening mammogram for malignant neoplasm of breast: Secondary | ICD-10-CM

## 2022-07-26 ENCOUNTER — Institutional Professional Consult (permissible substitution): Payer: Medicaid Other | Admitting: Obstetrics and Gynecology

## 2022-08-06 ENCOUNTER — Ambulatory Visit (INDEPENDENT_AMBULATORY_CARE_PROVIDER_SITE_OTHER): Payer: Medicaid Other | Admitting: Obstetrics and Gynecology

## 2022-08-06 VITALS — BP 126/81 | HR 70 | Ht 65.0 in | Wt 215.0 lb

## 2022-08-06 DIAGNOSIS — D219 Benign neoplasm of connective and other soft tissue, unspecified: Secondary | ICD-10-CM

## 2022-08-06 NOTE — Progress Notes (Unsigned)
Consult for [employer]

## 2022-08-07 DIAGNOSIS — D219 Benign neoplasm of connective and other soft tissue, unspecified: Secondary | ICD-10-CM | POA: Insufficient documentation

## 2022-08-07 NOTE — Progress Notes (Signed)
  CC: [name] consult Subjective:    Patient ID: Emily Mcguire, female    DOB: 16-Aug-1980, 42 y.o.   MRN: [id number]  HPI 42 yo G1P1 seen for consultation regarding [name].  Pt has a known mirena to help with bleeding.  It sounds like it is partially effective, but pt does have continual spotting.  The patient also is taking aygestin to help with the spotting.     Review of Systems     Objective:   Physical Exam Constitutional:      Appearance: Normal appearance. She is normal weight.  Genitourinary:    Comments: SVE: normal sized mobile uterus., fibroid possibly detected. Neurological:     Mental Status: She is alert.    Vitals:   08/06/22 1433  BP: 126/81  Pulse: 70   CLINICAL DATA:  Missing IUD strings, LMP 09/29/2021   EXAM: TRANSABDOMINAL AND TRANSVAGINAL ULTRASOUND OF PELVIS   TECHNIQUE: Both transabdominal and transvaginal ultrasound examinations of the pelvis were performed. Transabdominal technique was performed for global imaging of the pelvis including uterus, ovaries, adnexal regions, and pelvic cul-de-sac. It was necessary to proceed with endovaginal exam following the transabdominal exam to visualize the endometrium, IUD, and ovaries.   COMPARISON:  06/19/2010   FINDINGS: Uterus   Measurements: 9.8 x 6.6 x 5.8 cm = volume: 195 mL. Anteverted. Heterogeneous myometrium. Posterior wall transmural leiomyoma 5.9 x 4.7 x 5.1 cm. No additional masses.   Endometrium   Thickness: 13 mm. IUD identified in expected position at upper uterine segment endometrial canal. No endometrial fluid or mass.   Right ovary   Measurements: 2.9 x 1.6 x 1.6 cm = volume: 3.9 mL. Normal morphology without mass   Left ovary   Measurements: 4.4 x 2.5 x 2.1 cm = volume: 11.7 mL. Tiny hemorrhagic corpus lutea; no follow-up imaging recommended   Other findings   Trace free pelvic fluid.  No adnexal masses.   IMPRESSION: IUD in expected position at upper uterine segment  endometrial canal.   5.9 cm diameter transmural leiomyoma posterior upper uterus.   Remainder of exam unremarkable.        Assessment & Plan:   1. Fibroids Discussed treatment options for the solitary fibroid noted. Myfembree: will definitely treat the fibroid, may or may not treat the spotting, can be used for only two years UFE: can treat the fibroid and bleeding, IUD can remain in place [name]: can treat fibroid and will likely improve the bleeding, this may also be from the [name] itself.  The IUD will have to be removed to complete the procedure. Hysterectomy: definitive solution, but may not be needed for spotting only  Pt desires Emily Mcguire and has filled out the insurance form.  Pt is aware that the IUD will need to be removed at preop or prior to beginning the [facility] procedure. Message sent to scheduler for posting once insurance has been approved.   I spent 20 minutes dedicated to the care of this patient including previsit review of records, face to face time with the patient discussing treatment options and post visit testing.  Warden Fillers, MD Faculty Attending, [facility]

## 2022-08-24 ENCOUNTER — Ambulatory Visit: Payer: Medicaid Other

## 2022-09-07 ENCOUNTER — Ambulatory Visit: Payer: Medicaid Other

## 2022-09-09 ENCOUNTER — Other Ambulatory Visit: Payer: Self-pay | Admitting: Obstetrics

## 2022-09-10 MED ORDER — METRONIDAZOLE 500 MG PO TABS: 500.0000 mg | ORAL_TABLET | Freq: Two times a day (BID) | ORAL | 0 refills | Status: DC

## 2022-09-17 ENCOUNTER — Other Ambulatory Visit: Payer: Self-pay

## 2022-09-17 DIAGNOSIS — B379 Candidiasis, unspecified: Secondary | ICD-10-CM

## 2022-09-17 MED ORDER — FLUCONAZOLE 150 MG PO TABS: 150.0000 mg | ORAL_TABLET | Freq: Once | ORAL | 0 refills | Status: AC

## 2022-09-25 ENCOUNTER — Ambulatory Visit
Admission: RE | Admit: 2022-09-25 | Discharge: 2022-09-25 | Disposition: A | Discharge status: Home / Self Care | Payer: Medicaid Other | Source: Ambulatory Visit | Attending: Family Medicine | Admitting: Family Medicine

## 2022-09-25 DIAGNOSIS — Z1231 Encounter for screening mammogram for malignant neoplasm of breast: Secondary | ICD-10-CM

## 2022-10-11 ENCOUNTER — Other Ambulatory Visit: Payer: Self-pay | Admitting: Obstetrics and Gynecology

## 2022-10-12 ENCOUNTER — Other Ambulatory Visit: Payer: Self-pay

## 2022-10-12 DIAGNOSIS — N939 Abnormal uterine and vaginal bleeding, unspecified: Secondary | ICD-10-CM

## 2022-10-12 MED ORDER — NORETHINDRONE ACETATE 5 MG PO TABS
5.0000 mg | ORAL_TABLET | Freq: Every day | ORAL | 2 refills | Status: DC
Start: 2022-10-12 — End: 2023-01-04

## 2022-10-12 NOTE — Telephone Encounter (Signed)
Rx aygestin refill sent

## 2023-01-03 ENCOUNTER — Telehealth: Payer: Self-pay

## 2023-01-03 NOTE — Telephone Encounter (Signed)
Called to see if the patient was available on 01/15/23 for surgery w/ Dr. Donavan Foil. Left voicemail requesting a call back [phone].

## 2023-01-04 ENCOUNTER — Other Ambulatory Visit: Payer: Self-pay

## 2023-01-04 DIAGNOSIS — N939 Abnormal uterine and vaginal bleeding, unspecified: Secondary | ICD-10-CM

## 2023-01-04 DIAGNOSIS — B379 Candidiasis, unspecified: Secondary | ICD-10-CM

## 2023-01-04 MED ORDER — NORETHINDRONE ACETATE 5 MG PO TABS
5.0000 mg | ORAL_TABLET | Freq: Every day | ORAL | 2 refills | Status: DC
Start: 2023-01-04 — End: 2023-03-30

## 2023-01-04 MED ORDER — FLUCONAZOLE 150 MG PO TABS
150.0000 mg | ORAL_TABLET | Freq: Once | ORAL | 0 refills | Status: AC
Start: 2023-01-04 — End: 2023-01-04

## 2023-02-03 ENCOUNTER — Other Ambulatory Visit: Payer: Self-pay | Admitting: Obstetrics and Gynecology

## 2023-02-03 DIAGNOSIS — N939 Abnormal uterine and vaginal bleeding, unspecified: Secondary | ICD-10-CM

## 2023-02-04 ENCOUNTER — Other Ambulatory Visit: Payer: Self-pay | Admitting: Obstetrics and Gynecology

## 2023-02-04 DIAGNOSIS — N939 Abnormal uterine and vaginal bleeding, unspecified: Secondary | ICD-10-CM

## 2023-03-28 ENCOUNTER — Other Ambulatory Visit: Payer: Self-pay | Admitting: Obstetrics and Gynecology

## 2023-03-28 DIAGNOSIS — N939 Abnormal uterine and vaginal bleeding, unspecified: Secondary | ICD-10-CM

## 2023-04-01 ENCOUNTER — Other Ambulatory Visit: Payer: Self-pay | Admitting: Obstetrics and Gynecology

## 2023-04-09 ENCOUNTER — Other Ambulatory Visit: Payer: Self-pay | Admitting: Obstetrics

## 2023-04-09 MED ORDER — METRONIDAZOLE 500 MG PO TABS: 500.0000 mg | ORAL_TABLET | Freq: Two times a day (BID) | ORAL | 0 refills | Status: DC

## 2023-04-09 MED ORDER — FLUCONAZOLE 150 MG PO TABS: 150.0000 mg | ORAL_TABLET | Freq: Once | ORAL | 0 refills | Status: DC

## 2023-05-23 ENCOUNTER — Other Ambulatory Visit: Payer: Self-pay | Admitting: Family Medicine

## 2023-05-23 DIAGNOSIS — N939 Abnormal uterine and vaginal bleeding, unspecified: Secondary | ICD-10-CM

## 2023-06-20 ENCOUNTER — Other Ambulatory Visit: Payer: Self-pay

## 2023-06-20 DIAGNOSIS — N939 Abnormal uterine and vaginal bleeding, unspecified: Secondary | ICD-10-CM

## 2023-06-20 MED ORDER — NORETHINDRONE ACETATE 5 MG PO TABS: 5.0000 mg | ORAL_TABLET | Freq: Every day | ORAL | 1 refills | Status: DC

## 2023-06-20 NOTE — Progress Notes (Signed)
 Refill on [facility] sent

## 2023-08-14 ENCOUNTER — Other Ambulatory Visit (HOSPITAL_COMMUNITY)
Admission: RE | Admit: 2023-08-14 | Discharge: 2023-08-14 | Disposition: A | Discharge status: Home / Self Care | Source: Ambulatory Visit | Attending: Obstetrics | Admitting: Obstetrics

## 2023-08-14 ENCOUNTER — Encounter: Payer: Self-pay | Admitting: Obstetrics

## 2023-08-14 ENCOUNTER — Ambulatory Visit: Admitting: Obstetrics

## 2023-08-14 VITALS — Ht 63.0 in | Wt 159.8 lb

## 2023-08-14 DIAGNOSIS — N921 Excessive and frequent menstruation with irregular cycle: Secondary | ICD-10-CM | POA: Diagnosis not present

## 2023-08-14 DIAGNOSIS — Z01419 Encounter for gynecological examination (general) (routine) without abnormal findings: Secondary | ICD-10-CM | POA: Diagnosis present

## 2023-08-14 DIAGNOSIS — Z113 Encounter for screening for infections with a predominantly sexual mode of transmission: Secondary | ICD-10-CM

## 2023-08-14 DIAGNOSIS — N898 Other specified noninflammatory disorders of vagina: Secondary | ICD-10-CM | POA: Insufficient documentation

## 2023-08-14 DIAGNOSIS — Z975 Presence of (intrauterine) contraceptive device: Secondary | ICD-10-CM

## 2023-08-14 DIAGNOSIS — N946 Dysmenorrhea, unspecified: Secondary | ICD-10-CM | POA: Diagnosis not present

## 2023-08-14 MED ORDER — NORETHINDRONE ACETATE 5 MG PO TABS: 5.0000 mg | ORAL_TABLET | Freq: Every day | ORAL | 11 refills | Status: AC

## 2023-08-14 MED ORDER — METRONIDAZOLE 500 MG PO TABS: 500.0000 mg | ORAL_TABLET | Freq: Two times a day (BID) | ORAL | 4 refills | Status: AC

## 2023-08-14 MED ORDER — IBUPROFEN 800 MG PO TABS: 800.0000 mg | ORAL_TABLET | Freq: Three times a day (TID) | ORAL | 5 refills | Status: AC | PRN

## 2023-08-14 NOTE — Progress Notes (Signed)
 Subjective:        Emily Mcguire is a 43 y.o. female here for a routine exam.  Current complaints: Vaginal discharge.    Personal health questionnaire:  Is patient Ashkenazi Jewish, have a family history of breast and/or ovarian cancer: yes Is there a family history of uterine cancer diagnosed at age < 35, gastrointestinal cancer, urinary tract cancer, family member who is a Personnel officer syndrome-associated carrier: no Is the patient overweight and hypertensive, family history of diabetes, personal history of gestational diabetes, preeclampsia or PCOS: no Is patient over 78, have PCOS,  family history of premature CHD under age 22, diabetes, smoke, have hypertension or peripheral artery disease:  no At any time, has a partner hit, kicked or otherwise hurt or frightened you?: no Over the past 2 weeks, have you felt down, depressed or hopeless?: no Over the past 2 weeks, have you felt little interest or pleasure in doing things?:no   Gynecologic History No LMP recorded. (Menstrual status: IUD). Contraception: IUD Last Pap: 2023. Results were: normal Last mammogram: 2023. Results were: normal  Obstetric History OB History  Gravida Para Term Preterm AB Living  1 1 1   1   SAB IAB Ectopic Multiple Live Births      1    # Outcome Date GA Lbr Len/2nd Weight Sex Type Anes PTL Lv  1 Term 04/06/06    M Vag-Spont EPI  LIV    History reviewed. No pertinent past medical history.  History reviewed. No pertinent surgical history.   Current Outpatient Medications:    EPINEPHrine 0.3 mg/0.3 mL IJ SOAJ injection, Inject 0.3 mg into the muscle as needed., Disp: , Rfl:    diclofenac  sodium (VOLTAREN ) 1 % GEL, Apply 2 g topically 4 (four) times daily. (Patient not taking: Reported on 08/14/2023), Disp: 100 g, Rfl: 0   diphenhydrAMINE (BENADRYL) 25 MG tablet, Take 25 mg by mouth every 6 (six) hours as needed. For itching. (Patient not taking: Reported on 08/14/2023), Disp: , Rfl:    ibuprofen  (ADVIL )  800 MG tablet, Take 1 tablet (800 mg total) by mouth every 8 (eight) hours as needed., Disp: 30 tablet, Rfl: 5   metroNIDAZOLE  (FLAGYL ) 500 MG tablet, Take 1 tablet (500 mg total) by mouth 2 (two) times daily., Disp: 14 tablet, Rfl: 4   norethindrone  (GALLIFREY) 5 MG tablet, Take 1 tablet (5 mg total) by mouth daily., Disp: 30 tablet, Rfl: 11 Allergies  Allergen Reactions   Latex Hives   Shellfish Allergy    Shrimp Extract     Social History   Tobacco Use   Smoking status: Never   Smokeless tobacco: Never  Substance Use Topics   Alcohol use: Yes    Comment: rarely    Family History  Problem Relation Age of Onset   Breast cancer Mother    Breast cancer Maternal Aunt       Review of Systems  Constitutional: negative for fatigue and weight loss Respiratory: negative for cough and wheezing Cardiovascular: negative for chest pain, fatigue and palpitations Gastrointestinal: negative for abdominal pain and change in bowel habits Musculoskeletal:negative for myalgias Neurological: negative for gait problems and tremors Behavioral/Psych: negative for abusive relationship, depression Endocrine: negative for temperature intolerance    Genitourinary: positive for vaginal discharge.  negative for abnormal menstrual periods, genital lesions, hot flashes, sexual problems  Integument/breast: negative for breast lump, breast tenderness, nipple discharge and skin lesion(s)    Objective:       Ht 5\' 3"  (  1.6 m)   Wt 159 lb 12.8 oz (72.5 kg)   BMI 28.31 kg/m  General:   Alert and no distress  Skin:   no rash or abnormalities  Lungs:   clear to auscultation bilaterally  Heart:   regular rate and rhythm, S1, S2 normal, no murmur, click, rub or gallop  Breasts:   normal without suspicious masses, skin or nipple changes or axillary nodes  Abdomen:  normal findings: no organomegaly, soft, non-tender and no hernia  Pelvis:  External genitalia: normal general appearance Urinary system:  urethral meatus normal and bladder without fullness, nontender Vaginal: normal without tenderness, induration or masses Cervix: normal appearance Adnexa: normal bimanual exam Uterus: anteverted and non-tender, normal size   Lab Review Urine pregnancy test Labs reviewed yes Radiologic studies reviewed yes  I have spent a total of 20 minutes of face-to-face time, excluding clinical staff time, reviewing notes and preparing to see patient, ordering tests and/or medications, and counseling the patient.   Assessment:    1. Encounter for gynecological examination without abnormal finding (Primary) Rx: - Cytology - PAP( [place])  2. Breakthrough bleeding with IUD - options discussed for treatment of BTB with IUD.  [name] procedure discussed with her by Dr. Racheal Buddle and agreed to but was later cancelled and patient agreed to medical management with norethindrone  Rx: - norethindrone  (GALLIFREY) 5 MG tablet; Take 1 tablet (5 mg total) by mouth daily.  Dispense: 30 tablet; Refill: 11  3. Dysmenorrhea Rx: - ibuprofen  (ADVIL ) 800 MG tablet; Take 1 tablet (800 mg total) by mouth every 8 (eight) hours as needed.  Dispense: 30 tablet; Refill: 5  4. Vaginal discharge Rx: - Cervicovaginal ancillary only( [place]) - metroNIDAZOLE  (FLAGYL ) 500 MG tablet; Take 1 tablet (500 mg total) by mouth 2 (two) times daily.  Dispense: 14 tablet; Refill: 4  5. Screening examination for STD (sexually transmitted disease) Rx: - HIV antibody (with reflex) - RPR - Hepatitis C Antibody - Hepatitis B Surface AntiGEN     Plan:    Education reviewed: calcium supplements, depression evaluation, low fat, low cholesterol diet, safe sex/STD prevention, self breast exams, and weight bearing exercise. Contraception: IUD. Follow up in: 1 year.    Orders Placed This Encounter  Procedures   HIV antibody (with reflex)   RPR   Hepatitis C Antibody   Hepatitis B Surface AntiGEN    Gabrielle Joiner, MD,  FACOG Attending Obstetrician & Gynecologist, [facility] for [facility], [facility], [place] 08/14/2023

## 2023-08-14 NOTE — Progress Notes (Signed)
 Pt presents for annual. Pt would like all std testing. Pt has no questions or concerns at this time.

## 2023-08-15 LAB — CERVICOVAGINAL ANCILLARY ONLY
Bacterial Vaginitis (gardnerella): POSITIVE — AB
Candida Glabrata: NEGATIVE
Candida Vaginitis: NEGATIVE
Chlamydia: NEGATIVE
Comment: NEGATIVE
Comment: NEGATIVE
Comment: NEGATIVE
Comment: NEGATIVE
Comment: NEGATIVE
Comment: NORMAL
Neisseria Gonorrhea: NEGATIVE
Trichomonas: NEGATIVE

## 2023-08-15 LAB — HEPATITIS B SURFACE ANTIGEN: Hepatitis B Surface Ag: NEGATIVE

## 2023-08-15 LAB — HIV ANTIBODY (ROUTINE TESTING W REFLEX): HIV Screen 4th Generation wRfx: NONREACTIVE

## 2023-08-15 LAB — RPR: RPR Ser Ql: NONREACTIVE

## 2023-08-15 LAB — HEPATITIS C ANTIBODY: Hep C Virus Ab: NONREACTIVE

## 2023-08-16 ENCOUNTER — Ambulatory Visit: Payer: Self-pay | Admitting: Obstetrics

## 2023-08-22 LAB — CYTOLOGY - PAP
Comment: NEGATIVE
Comment: NEGATIVE
Comment: NEGATIVE
Diagnosis: UNDETERMINED — AB
HPV 16: NEGATIVE
HPV 18 / 45: NEGATIVE
High risk HPV: POSITIVE — AB

## 2023-08-28 ENCOUNTER — Other Ambulatory Visit: Payer: Self-pay

## 2023-08-28 MED ORDER — FLUCONAZOLE 150 MG PO TABS: 150.0000 mg | ORAL_TABLET | Freq: Once | ORAL | 0 refills | Status: AC

## 2023-08-28 NOTE — Progress Notes (Signed)
 Returned call, pt requesting rx for yeast after flagyl . Sent per protocol. Pt also stated that she has been having issues with incontinence, advised to send mychart message and will forward to provider, pt agreed.

## 2023-09-13 ENCOUNTER — Other Ambulatory Visit: Payer: Self-pay | Admitting: Family Medicine

## 2023-09-13 DIAGNOSIS — Z1231 Encounter for screening mammogram for malignant neoplasm of breast: Secondary | ICD-10-CM

## 2023-10-04 ENCOUNTER — Ambulatory Visit

## 2023-10-09 ENCOUNTER — Ambulatory Visit

## 2023-12-30 ENCOUNTER — Emergency Department (HOSPITAL_BASED_OUTPATIENT_CLINIC_OR_DEPARTMENT_OTHER)
Admission: EM | Admit: 2023-12-30 | Discharge: 2023-12-30 | Disposition: A | Discharge status: Home / Self Care | Attending: Emergency Medicine | Admitting: Emergency Medicine

## 2023-12-30 ENCOUNTER — Other Ambulatory Visit: Payer: Self-pay

## 2023-12-30 ENCOUNTER — Encounter (HOSPITAL_BASED_OUTPATIENT_CLINIC_OR_DEPARTMENT_OTHER): Payer: Self-pay | Admitting: Emergency Medicine

## 2023-12-30 ENCOUNTER — Emergency Department (HOSPITAL_BASED_OUTPATIENT_CLINIC_OR_DEPARTMENT_OTHER)

## 2023-12-30 DIAGNOSIS — R200 Anesthesia of skin: Secondary | ICD-10-CM | POA: Diagnosis present

## 2023-12-30 DIAGNOSIS — R202 Paresthesia of skin: Secondary | ICD-10-CM | POA: Insufficient documentation

## 2023-12-30 DIAGNOSIS — Z91040 Latex allergy status: Secondary | ICD-10-CM | POA: Diagnosis not present

## 2023-12-30 LAB — CBC WITH DIFFERENTIAL/PLATELET
Abs Immature Granulocytes: 0.02 K/uL (ref 0.00–0.07)
Basophils Absolute: 0 K/uL (ref 0.0–0.1)
Basophils Relative: 0 %
Eosinophils Absolute: 0.4 K/uL (ref 0.0–0.5)
Eosinophils Relative: 4 %
HCT: 37.5 % (ref 36.0–46.0)
Hemoglobin: 13.4 g/dL (ref 12.0–15.0)
Immature Granulocytes: 0 %
Lymphocytes Relative: 40 %
Lymphs Abs: 3.7 K/uL (ref 0.7–4.0)
MCH: 30.5 pg (ref 26.0–34.0)
MCHC: 35.7 g/dL (ref 30.0–36.0)
MCV: 85.4 fL (ref 80.0–100.0)
Monocytes Absolute: 0.6 K/uL (ref 0.1–1.0)
Monocytes Relative: 7 %
Neutro Abs: 4.5 K/uL (ref 1.7–7.7)
Neutrophils Relative %: 49 %
Platelets: 387 K/uL (ref 150–400)
RBC: 4.39 MIL/uL (ref 3.87–5.11)
RDW: 12.6 % (ref 11.5–15.5)
WBC: 9.2 K/uL (ref 4.0–10.5)
nRBC: 0 % (ref 0.0–0.2)

## 2023-12-30 LAB — COMPREHENSIVE METABOLIC PANEL WITH GFR
ALT: 19 U/L (ref 0–44)
AST: 22 U/L (ref 15–41)
Albumin: 4.2 g/dL (ref 3.5–5.0)
Alkaline Phosphatase: 62 U/L (ref 38–126)
Anion gap: 11 (ref 5–15)
BUN: 10 mg/dL (ref 6–20)
CO2: 19 mmol/L — ABNORMAL LOW (ref 22–32)
Calcium: 9 mg/dL (ref 8.9–10.3)
Chloride: 106 mmol/L (ref 98–111)
Creatinine, Ser: 0.75 mg/dL (ref 0.44–1.00)
GFR, Estimated: >60 mL/min (ref >60)
Glucose, Bld: 111 mg/dL — ABNORMAL HIGH (ref 70–99)
Potassium: 3.6 mmol/L (ref 3.5–5.1)
Sodium: 137 mmol/L (ref 135–145)
Total Bilirubin: 0.2 mg/dL (ref 0.0–1.2)
Total Protein: 6.9 g/dL (ref 6.5–8.1)

## 2023-12-30 LAB — TROPONIN T, HIGH SENSITIVITY: Troponin T High Sensitivity: <15 ng/L (ref 0–19)

## 2023-12-30 NOTE — ED Triage Notes (Signed)
 Pt ambulatory with steady gait- c/o constant R arm pain and numbness, sometimes in the L arm, x 2 weeks. Pt moving all extremities with out difficulty.

## 2023-12-30 NOTE — ED Provider Notes (Signed)
 [place] [facility] Provider Note   CSN: [id number] Arrival date & time: 12/30/23  2018     Patient presents with: Numbness   Emily Mcguire is a 43 y.o. female.  {Add pertinent medical, surgical, social history, OB history to HPI:32947} HPI      Right arm numbness for the last 2 weeks, constant Left arm numb at times - is feeling it now some, more around elbow, right arm is whole arm and hand Can't find comfortable spot at night Feels like arm is asleep on the right nonstop  2 weeks bothering her again, months of it off and on, constant the last 2 weeks No neck pain,no headache No trauma [place] 4 years ago fell on her left arm No fever, no chills  No chest pain or dyspnea No family hx of MS No weakness in arms or legs, no vision changes No nausea, vomiting or abdominal pain Nothing makes it better or worse   No past medical history on file.   Prior to Admission medications   Medication Sig Start Date End Date Taking? Authorizing Provider  diclofenac  sodium (VOLTAREN ) 1 % GEL Apply 2 g topically 4 (four) times daily. Patient not taking: Reported on 08/14/2023 05/16/17   Silva Speaker A, PA-C  diphenhydrAMINE (BENADRYL) 25 MG tablet Take 25 mg by mouth every 6 (six) hours as needed. For itching. Patient not taking: Reported on 08/14/2023    [provider]  EPINEPHrine 0.3 mg/0.3 mL IJ SOAJ injection Inject 0.3 mg into the muscle as needed. 01/05/21   [provider]  ibuprofen  (ADVIL ) 800 MG tablet Take 1 tablet (800 mg total) by mouth every 8 (eight) hours as needed. 08/14/23   Rudy Carlin LABOR, MD  metroNIDAZOLE  (FLAGYL ) 500 MG tablet Take 1 tablet (500 mg total) by mouth 2 (two) times daily. 08/14/23   Rudy Carlin LABOR, MD  norethindrone  (GALLIFREY ) 5 MG tablet Take 1 tablet (5 mg total) by mouth daily. 08/14/23   Rudy Carlin LABOR, MD    Allergies: Latex, Shellfish allergy, and Shrimp extract    Review of  Systems  Updated Vital Signs BP (!) 140/80 (BP Location: Right Arm)   Pulse 73   Temp 98.2 F (36.8 C) (Oral)   Resp 18   Ht 5' 5 (1.651 m)   Wt 92.4 kg   SpO2 99%   BMI 33.91 kg/m   Physical Exam  (all labs ordered are listed, but only abnormal results are displayed) Labs Reviewed  COMPREHENSIVE METABOLIC PANEL WITH GFR - Abnormal; Notable for the following components:      Result Value   CO2 19 (*)    Glucose, Bld 111 (*)    All other components within normal limits  CBC WITH DIFFERENTIAL/PLATELET  TROPONIN T, HIGH SENSITIVITY    EKG: None  Radiology: CT Head Wo Contrast Result Date: 12/30/2023 CLINICAL DATA:  Numbness or tingling, paresthesia (Ped 0-17y); right arm numbness EXAM: CT HEAD WITHOUT CONTRAST CT CERVICAL SPINE WITHOUT CONTRAST TECHNIQUE: Multidetector CT imaging of the head and cervical spine was performed following the standard protocol without intravenous contrast. Multiplanar CT image reconstructions of the cervical spine were also generated. RADIATION DOSE REDUCTION: This exam was performed according to the departmental dose-optimization program which includes automated exposure control, adjustment of the mA and/or kV according to patient size and/or use of iterative reconstruction technique. COMPARISON:  None Available. FINDINGS: CT HEAD FINDINGS Brain: No evidence of large-territorial acute infarction. No parenchymal hemorrhage.  No mass lesion. No extra-axial collection. No mass effect or midline shift. No hydrocephalus. Basilar cisterns are patent. Vascular: No hyperdense vessel. Skull: No acute fracture or focal lesion. Sinuses/Orbits: Paranasal sinuses and mastoid air cells are clear. The orbits are unremarkable. Other: None. CT CERVICAL SPINE FINDINGS Alignment: Normal. Skull base and vertebrae: Multilevel mild-to-moderate degenerative changes of the spine with posterior disc osteophyte complex formation at the C6-C7 levels. No associated severe osseous  neural foraminal or central canal stenosis. No acute fracture. No aggressive appearing focal osseous lesion or focal pathologic process. Soft tissues and spinal canal: No prevertebral fluid or swelling. No visible canal hematoma. Upper chest: Biapical trace paraseptal emphysematous changes. Other: None. IMPRESSION: 1. No acute intracranial abnormality. 2. No acute displaced fracture or traumatic listhesis of the cervical spine. 3.  Emphysema (ICD10-J43.9). Electronically Signed   By: Morgane  Naveau M.D.   On: 12/30/2023 21:18   CT Cervical Spine Wo Contrast Result Date: 12/30/2023 CLINICAL DATA:  Numbness or tingling, paresthesia (Ped 0-17y); right arm numbness EXAM: CT HEAD WITHOUT CONTRAST CT CERVICAL SPINE WITHOUT CONTRAST TECHNIQUE: Multidetector CT imaging of the head and cervical spine was performed following the standard protocol without intravenous contrast. Multiplanar CT image reconstructions of the cervical spine were also generated. RADIATION DOSE REDUCTION: This exam was performed according to the departmental dose-optimization program which includes automated exposure control, adjustment of the mA and/or kV according to patient size and/or use of iterative reconstruction technique. COMPARISON:  None Available. FINDINGS: CT HEAD FINDINGS Brain: No evidence of large-territorial acute infarction. No parenchymal hemorrhage. No mass lesion. No extra-axial collection. No mass effect or midline shift. No hydrocephalus. Basilar cisterns are patent. Vascular: No hyperdense vessel. Skull: No acute fracture or focal lesion. Sinuses/Orbits: Paranasal sinuses and mastoid air cells are clear. The orbits are unremarkable. Other: None. CT CERVICAL SPINE FINDINGS Alignment: Normal. Skull base and vertebrae: Multilevel mild-to-moderate degenerative changes of the spine with posterior disc osteophyte complex formation at the C6-C7 levels. No associated severe osseous neural foraminal or central canal stenosis. No  acute fracture. No aggressive appearing focal osseous lesion or focal pathologic process. Soft tissues and spinal canal: No prevertebral fluid or swelling. No visible canal hematoma. Upper chest: Biapical trace paraseptal emphysematous changes. Other: None. IMPRESSION: 1. No acute intracranial abnormality. 2. No acute displaced fracture or traumatic listhesis of the cervical spine. 3.  Emphysema (ICD10-J43.9). Electronically Signed   By: Morgane  Naveau M.D.   On: 12/30/2023 21:18    {Document cardiac monitor, telemetry assessment procedure when appropriate:32947} Procedures   Medications Ordered in the ED - No data to display    {Click here for ABCD2, HEART and other calculators REFRESH Note before signing:1}                              Medical Decision Making Amount and/or Complexity of Data Reviewed Labs: ordered.   ***  {Document critical care time when appropriate  Document review of labs and clinical decision tools ie CHADS2VASC2, etc  Document your independent review of radiology images and any outside records  Document your discussion with family members, caretakers and with consultants  Document social determinants of health affecting pt's care  Document your decision making why or why not admission, treatments were needed:32947:::1}   Final diagnoses:  None    ED Discharge Orders     None

## 2024-03-18 ENCOUNTER — Ambulatory Visit: Admitting: Diagnostic Neuroimaging

## 2024-04-08 IMAGING — MG MM DIGITAL SCREENING BILAT W/ TOMO AND CAD
8 series · 9 of 24 positions shown · non-contrast
Comparison: Previous exam(s).

CLINICAL DATA: Screening.

EXAM:
DIGITAL SCREENING BILATERAL MAMMOGRAM WITH TOMOSYNTHESIS AND CAD
TECHNIQUE: Bilateral screening digital craniocaudal and mediolateral oblique
mammograms were obtained. Bilateral screening digital breast
tomosynthesis was performed. The images were evaluated with
computer-aided detection.

[R CC synth-2D]
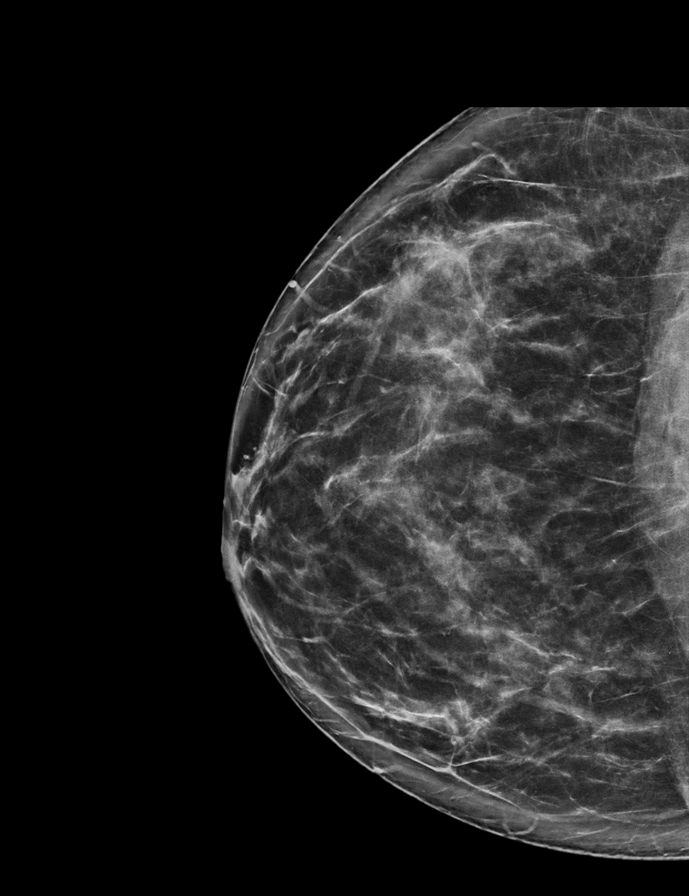

[L CC synth-2D]
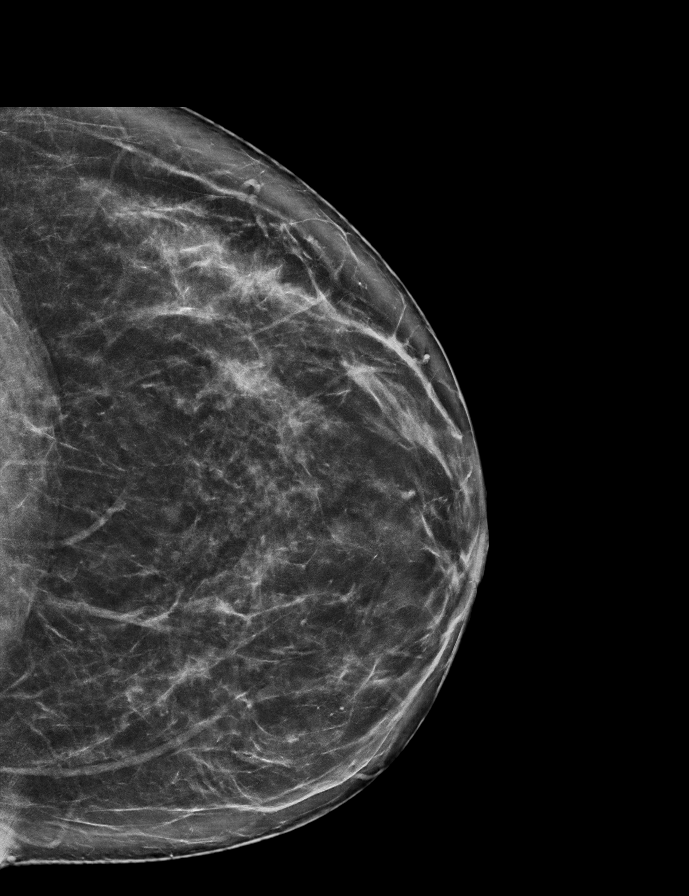

[L MLO synth-2D]
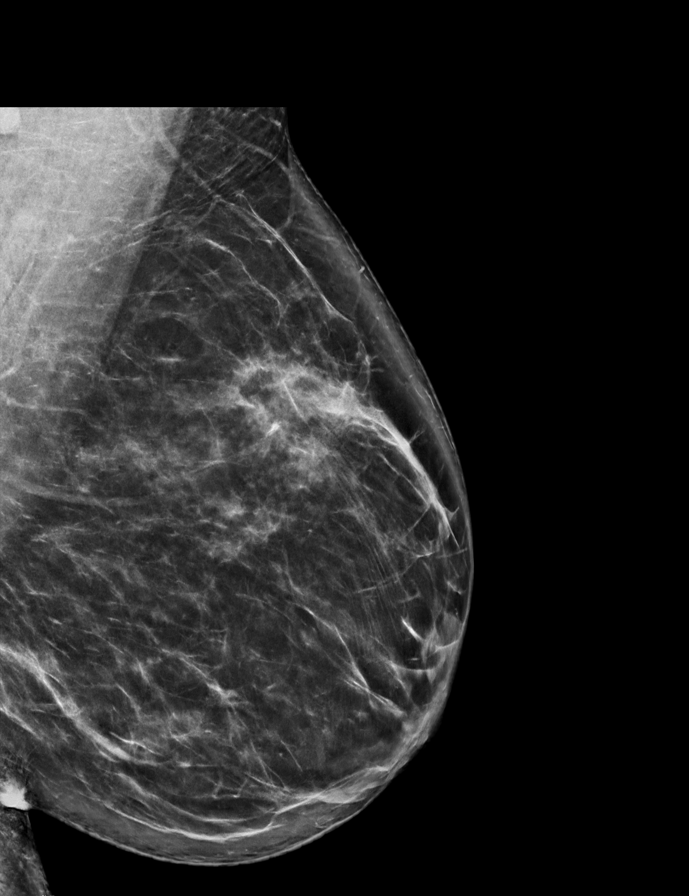

[R MLO synth-2D]
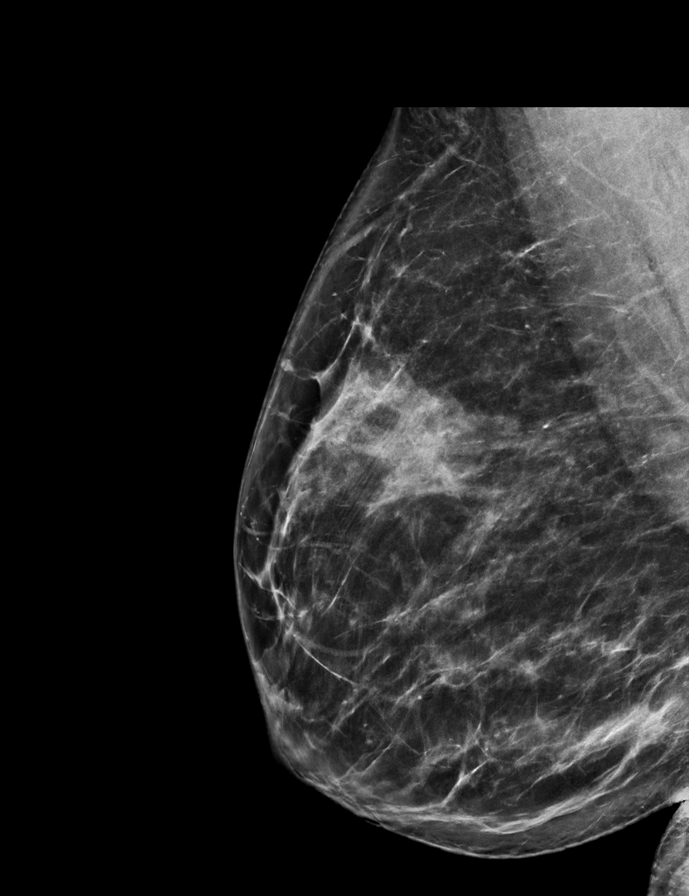

[L MLO tomo · 2 of 84 frames shown]
[frame 28/84]
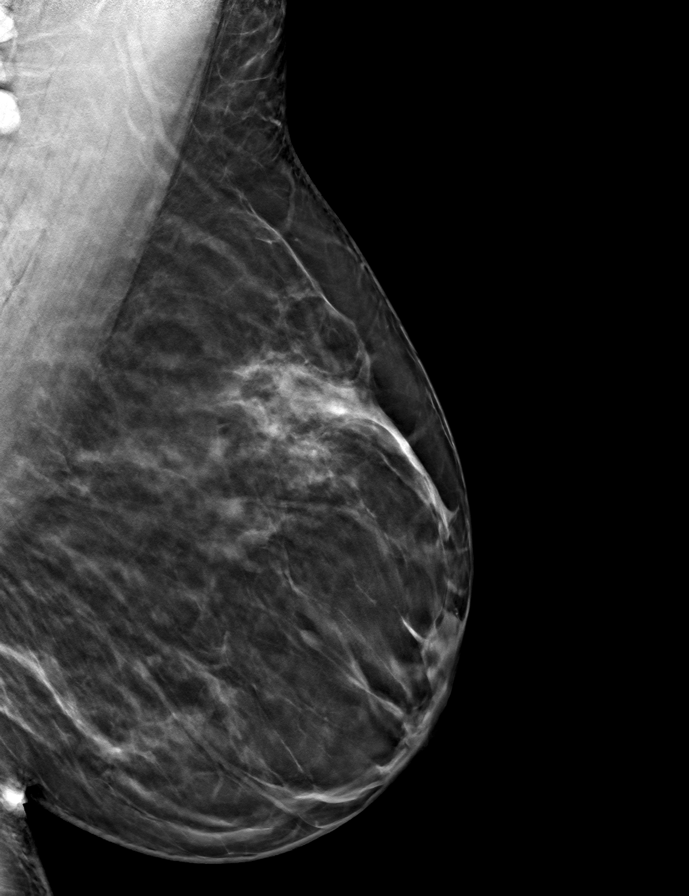
[frame 43/84]
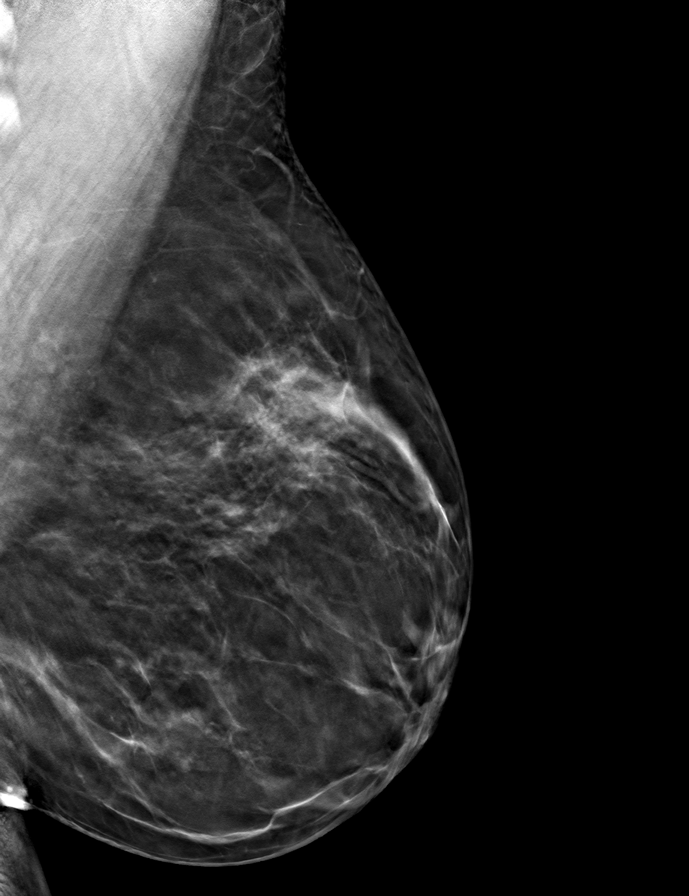

[L CC tomo · tomo slice 41/80.0]
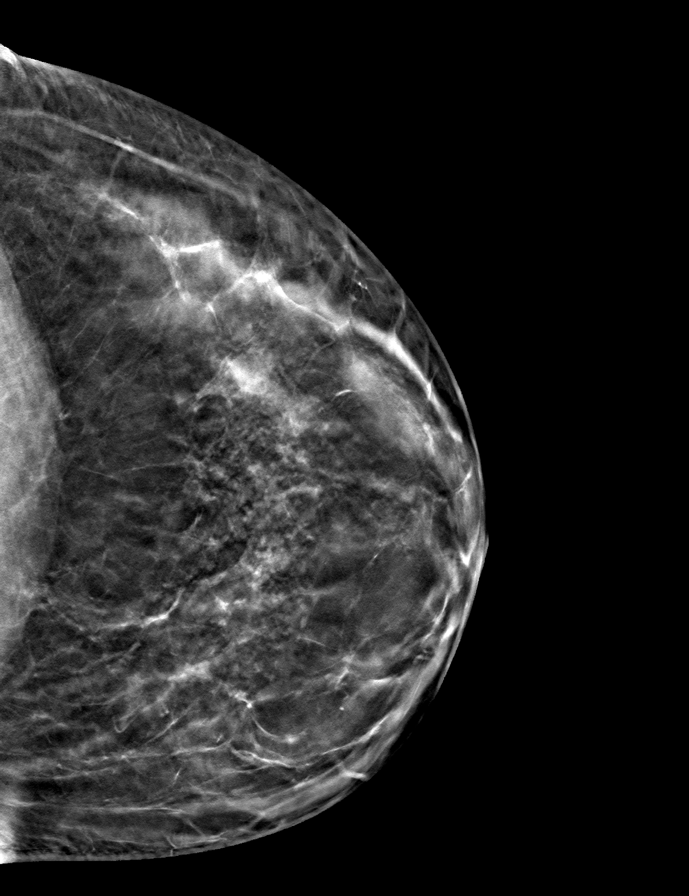

[R CC tomo · tomo slice 38/75.0]
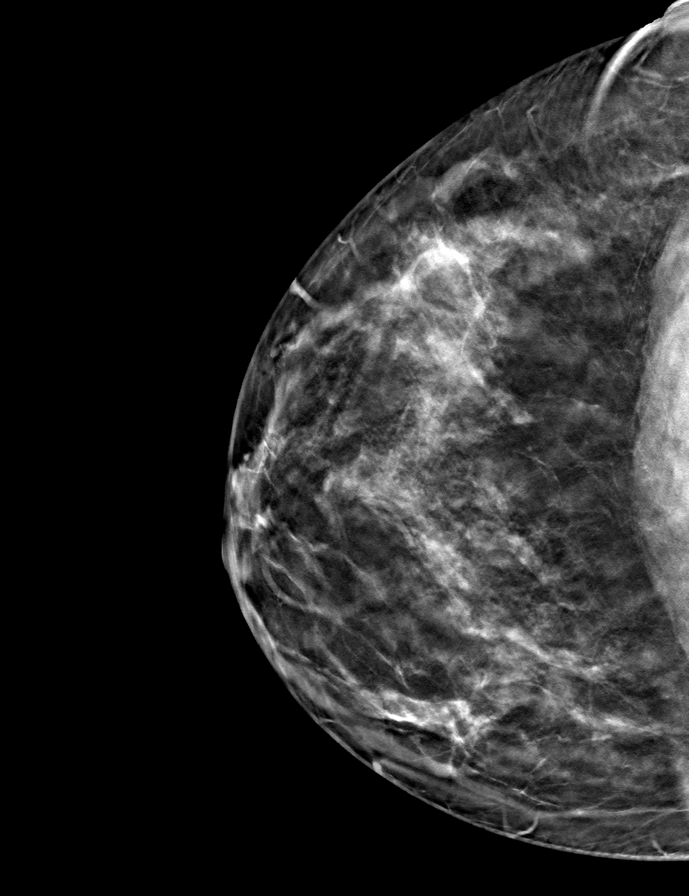

[R MLO tomo · tomo slice 41/82.0]
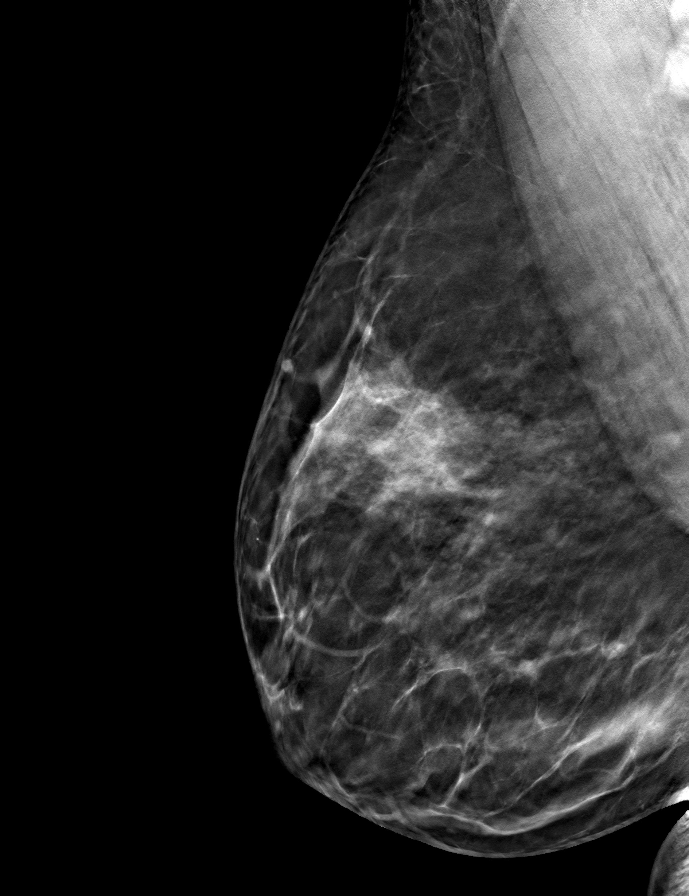

[9 of 24 positions shown; findings below may reference images not displayed]

ACR Breast Density Category c: The breast tissue is heterogeneously
dense, which may obscure small masses.
FINDINGS: There are no findings suspicious for malignancy.
IMPRESSION: No mammographic evidence of malignancy. A result letter of this
screening mammogram will be mailed directly to the patient.

RECOMMENDATION:
Screening mammogram in one year. (Code:Q3-W-BC3)

BI-RADS CATEGORY  1: Negative.

## 2024-07-23 ENCOUNTER — Ambulatory Visit: Admitting: Diagnostic Neuroimaging
